# Patient Record
Sex: Male | Born: 1960 | Race: White | Hispanic: No | Marital: Married | Smoking: Former smoker
Health system: Southern US, Community
[De-identification: ages and names within clinical notes are randomized; demographics above are authoritative.]

## PROBLEM LIST (undated history)

## (undated) DIAGNOSIS — E785 Hyperlipidemia, unspecified: Secondary | ICD-10-CM

## (undated) DIAGNOSIS — F5104 Psychophysiologic insomnia: Secondary | ICD-10-CM

## (undated) HISTORY — PX: OTHER SURGICAL HISTORY: SHX169

## (undated) HISTORY — PX: COLONOSCOPY: SHX174

## (undated) HISTORY — PX: TONSILLECTOMY: SUR1361

## (undated) HISTORY — DX: Psychophysiologic insomnia: F51.04

## (undated) HISTORY — DX: Hyperlipidemia, unspecified: E78.5

---

## 2008-03-17 ENCOUNTER — Encounter: Payer: Self-pay | Admitting: Internal Medicine

## 2009-12-24 ENCOUNTER — Ambulatory Visit: Payer: Self-pay | Admitting: Internal Medicine

## 2009-12-24 DIAGNOSIS — G4700 Insomnia, unspecified: Secondary | ICD-10-CM

## 2009-12-30 DIAGNOSIS — E785 Hyperlipidemia, unspecified: Secondary | ICD-10-CM | POA: Insufficient documentation

## 2010-01-06 ENCOUNTER — Telehealth: Payer: Self-pay | Admitting: Internal Medicine

## 2010-01-11 ENCOUNTER — Encounter: Payer: Self-pay | Admitting: Internal Medicine

## 2010-02-04 ENCOUNTER — Ambulatory Visit: Payer: Self-pay | Admitting: Internal Medicine

## 2010-02-08 ENCOUNTER — Telehealth (INDEPENDENT_AMBULATORY_CARE_PROVIDER_SITE_OTHER): Payer: Self-pay | Admitting: *Deleted

## 2010-05-05 ENCOUNTER — Ambulatory Visit: Payer: Self-pay | Admitting: Internal Medicine

## 2010-09-14 ENCOUNTER — Telehealth: Payer: Self-pay | Admitting: Internal Medicine

## 2010-11-01 ENCOUNTER — Ambulatory Visit
Admission: RE | Admit: 2010-11-01 | Discharge: 2010-11-01 | Payer: Self-pay | Source: Home / Self Care | Attending: Internal Medicine | Admitting: Internal Medicine

## 2010-11-01 DIAGNOSIS — Z9189 Other specified personal risk factors, not elsewhere classified: Secondary | ICD-10-CM | POA: Insufficient documentation

## 2010-11-01 NOTE — Progress Notes (Signed)
Summary: prescripts  Phone Note From Other Clinic Call back at [phone]   [facility] Call For: young Summary of Call: have questions about flurazepam and lorazepam prescripts Initial call taken by: Rickard Patience,  Feb 08, 2010 2:07 PM  Follow-up for Phone Call        called and spoke with pharmacist.  pharmacist wanted to make sure CY knew pt was on both these meds.  informed pharmacist that per last OV note on 02-04-2010, pt was to try this new med to see if it helped his symptoms better vs the lorazapam.  pharmacist verbalized understanding.  Aundra Millet Reynolds LPN  Feb 08, 2010 2:26 PM   Problem # 1:  INSOMNIA, CHRONIC (ICD-307.42) He tosses and turns a lot in sleep. We will try a Berhe half-life sleep med.

## 2010-11-01 NOTE — Assessment & Plan Note (Signed)
Summary: 1 month/apc   Primary Provider/Referring Provider:  Royanne Foots  CC:  1 month followup.  Pt states that lunesta helps him to fall asleep- states that he wakes up about 2 hrs later and then has trouble falling back asleep.  Marland Kitchen  History of Present Illness:  History of Present Illness: December 24, 2009- 48 yoM self refferred for chronic insomnia with difficulty initiating sleep without medication. Onset around 1998. He remembers tendency to fall asleep in class around 1987-89.  [facility] [place] 2009- AHI=0/hr. Dx of insomnia was suggested. 2 other sleep studies in [facility]. Wife is separate room. She disturbs his sleep and she says he snores loudly.  In the evening he watches sports and gets excited til hard to wind down. Bedtime 11-1130PM, latency short with medicine "hours without". Up 7AM. Dozes off easily during day, especially havbing to fight sleep after lunch.  Has tried Restoril, Sonata, Temazepam, Lorazepam, Ambien, Xanax, wine, trazodone. Ambien worked til tolerance. Ambien CR caused depression.  Says at night he tends to lie with eyes shut and doze off and on. Every night takes some kind of med. No using Lorazepam 1-2 mg, with a fan for white noise. Caffeine only in AM  Feb 04, 2010- Insomnia Lunesta 3 mg would induce sleep, but for only 3 hours, then he would wake again, (taken with Lorazepam). Denies nightmares or physical discomfort. Just wakes up "racing mind'. He did look at CBT website but hasn't tried sustained practice with it. Recent TFT by his LMD reported normal. He has a brother who is also a chronic insomniac.    Current Medications (verified): 1)  Lorazepam 1 Mg Tabs (Lorazepam) .... Take 1 By Mouth Once Daily 2)  Lipitor 10 Mg Tabs (Atorvastatin Calcium) .... Take 1 By Mouth Once Daily 3)  Lunesta 3 Mg Tabs (Eszopiclone) .Marland Kitchen.. 1 At Bedtime As Needed  Allergies (verified): No Known Drug Allergies  Past History:  Past Medical History: Last updated:  12/24/2009 Chronic insomnia Hyperlipidemia  Past Surgical History: Last updated: 12/24/2009 Colonoscopy  Family History: Last updated: 12/24/2009 Emphysema-Father, aunt Cancer-Mother side  Social History: Last updated: 12/24/2009 Married with child Smoker for 13 years; quit in 1989. 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    Vital Signs:  Patient profile:   50 year old male Weight:      199 pounds O2 Sat:      100 % on Room air Pulse rate:   61 / minute BP sitting:   114 / 70  (left arm)  Vitals Entered By: Vernie Murders (Feb 04, 2010 2:32 PM)  O2 Flow:  Room air  Physical Exam  Additional Exam:  General: A/Ox3; pleasant and cooperative, NAD, trim, muscular man, mood/ affect is pleasant and appropriate. SKIN: no rash, lesions NODES: no lymphadenopathy HEENT: [place]/AT, EOM- WNL, Conjuctivae- clear, PERRLA, TM-WNL, Nose- clear, Throat- clear and wnl, Mallampati  III NECK: Supple w/ fair ROM, JVD- none, normal carotid impulses w/o bruits Thyroid- normal to palpation CHEST: Clear to P&A HEART: RRR, no m/g/r heard ABDOMEN: Soft and nl;  KGU:RKYH, nl pulses, no edema  NEURO: Grossly intact to observation      Impression & Recommendations:  Problem # 1:  INSOMNIA, CHRONIC (ICD-307.42)  He relaxes and can drift  off at home, hearing snoring as he drifts off. His wife tells him he snores loudly. The noise wakes him. He tosses and turns a lot in sleep. We will try a [place] half-life sleep med.  We may need to consider an apnea-link, then consider if he needs formal retest, since previous sleep study didn't show significant apnea.  Medications Added to Medication List This Visit: 1)  Flurazepam Hcl 30 Mg  Caps (Flurazepam hcl) .Marland Kitchen.. 1 for sleep as needed  Other Orders: Est. Patient Level III (16109)  Patient Instructions: 1)  Please schedule a follow-up appointment in 3 months. 2)  it may help you to keep working with the CBT web site. 3)  Try flurazepam for sleep. Script written. Prescriptions: FLURAZEPAM HCL 30 MG CAPS (FLURAZEPAM HCL) 1 for sleep as needed  #30 x 0   Entered and Authorized by:   Waymon Budge MD   Signed by:   Waymon Budge MD on 02/04/2010   Method used:   Print then Give to Patient   RxID:   [id number]

## 2010-11-01 NOTE — Progress Notes (Signed)
Summary: authorization  Phone Note Call from Patient   Caller: Patient Call For: young Summary of Call: need refill for lunesta [facility] [place] Initial call taken by: Rickard Patience,  January 06, 2010 4:50 PM  Follow-up for Phone Call        Called [facility] to initiate PA for lunesta.  Case ID number is [id number] ATC pt and inform this is being handled.  Had to [facility].  Form was recieved and placed in Dr Roxy Cedar lookat along with this msg to be addressed Vernie Murders  January 06, 2010 5:20 PM  Follow-up by: Vernie Murders,  January 06, 2010 4:59 PM  Additional Follow-up for Phone Call Additional follow up Details #1::        Form has been completed,signed, and faxed.Placed in awaiting answer pile in triage.Reynaldo Minium CMA  January 07, 2010 11:27 AM   Claris Che states pt called back very upset because he had not heard anything about PA. SH eadvised him it was in process. he is out of [place] and does not want to go the weekend without it. Pelase advise if we can give him samples to last a few days until we receive a response from ins comp, which should be in about 2 business days per insurance. Carron Curie CMA  January 07, 2010 4:05 PM     Additional Follow-up for Phone Call Additional follow up Details #2::    Please give pt enough samples to last until we can get an answer back from insurance.Reynaldo Minium CMA  January 07, 2010 4:29 PM   Dr Maple Hudson, please advise how many samples of lunesta 3 mg you want me to give this pt, thanks Vernie Murders  January 07, 2010 4:54 PM   Additional Follow-up for Phone Call Additional follow up Details #3:: Details for Additional Follow-up Action Taken: called and spoke with pt about the process of the prior auth--he didnt understand what that meant---so he is aware that we have samples up here for him to pick up---pt stated that he will just come on monday to pick these up as he is in [place].  i offered to leave them downstairs for him to pick up  tomorrow at the clinic but he stated that he did have some to get through the weekend with.  explained to pt that we will call him once the prior auth has been recieved back and let him know the results. Additional Follow-up by: Randell Loop CMA,  January 07, 2010 5:06 PM   Appended Document: authorization pt and pharmacy, [facility], notified of approval.

## 2010-11-01 NOTE — Assessment & Plan Note (Signed)
Summary: INSOMNIA- SELF REFERRAL ///KP   Primary Provider/Referring Provider:  Royanne Foots  CC:  Sleep-new pt..  History of Present Illness: December 24, 2009- 50 yoM self refferred for chronic insomnia with difficulty initiating sleep without medication. Onset around 1998. He remembers tendency to fall asleep in class around 1987-89.  [facility] [place] 2009- AHI=0/hr. Dx of insomnia was suggested. 2 other sleep studies in [facility]. Wife is separate room. She disturbs his sleep and she says he snores loudly.  In the evening he watches sports and gets excited til hard to wind down. Bedtime 11-1130PM, latency short with medicine "hours without". Up 7AM. Dozes off easily during day, especially havbing to fight sleep after lunch.  Has tried Restoril, Sonata, Temazepam, Lorazepam, Ambien, Xanax, wine, trazodone. Ambien worked til tolerance. Ambien CR caused depression.  Says at night he tends to lie with eyes shut and doze off and on. Every night takes some kind of med. No using Lorazepam 1-2 mg, with a fan for white noise. Caffeine only in AM  Current Medications (verified): 1)  Lorazepam 1 Mg Tabs (Lorazepam) .... Take 1 By Mouth Once Daily 2)  Lipitor 10 Mg Tabs (Atorvastatin Calcium) .... Take 1 By Mouth Once Daily  Allergies (verified): No Known Drug Allergies  Past History:  Family History: Last updated: 12/24/2009 Emphysema-Father, aunt Cancer-Mother side  Social History: Last updated: 12/24/2009 Married with child Smoker for 13 years; quit in 1989. 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  Vital Signs:  Patient profile:   50 year old male Height:      73 inches Weight:      205.13 pounds BMI:     27.16 O2 Sat:       95 % on Room air Pulse rate:   69 / minute BP sitting:   112 / 68  (left arm) Cuff size:   regular  Vitals Entered By: Reynaldo Minium CMA (December 24, 2009 3:09 PM)  O2 Flow:  Room air  Physical Exam  Additional Exam:  General: A/Ox3; pleasant and cooperative, NAD, trim, muscular man SKIN: no rash, lesions NODES: no lymphadenopathy HEENT: [place]/AT, EOM- WNL, Conjuctivae- clear, PERRLA, TM-WNL, Nose- clear, Throat- clear and wnl, Mallampati  III NECK: Supple w/ fair ROM, JVD- none, normal carotid impulses w/o bruits Thyroid- normal to palpation CHEST: Clear to P&A HEART: RRR, no m/g/r heard ABDOMEN: Soft and nl; nml bowel sounds; no organomegaly or masses noted ZOX:WRUE, nl pulses, no edema  NEURO: Grossly intact to observation      Impression & Recommendations:  Problem # 1:  INSOMNIA, CHRONIC (ICD-307.42) There is no hx of thyroid problem. We addressed sleep hygiene. He needs  some quit time before bed at night. He is told he snores, but he has had 3 sleep studies without diagnosing significant apnea. He seems healthy and is probably a natural light sleeper. We discussed cognitive behavioral therapy as a nonpharmaceutical approach. We will begin a trial of this with Lunesta and see how he does.  Medications Added to Medication List This Visit: 1)  Lorazepam 1 Mg Tabs (Lorazepam) .... Take 1 by mouth once daily 2)  Lipitor 10 Mg Tabs (Atorvastatin calcium) .... Take 1 by mouth once daily 3)  Lunesta 3 Mg Tabs (Eszopiclone) .Marland Kitchen.. 1 at bedtime as needed  Other  Orders: Consultation Level IV (19147)  Patient Instructions: 1)  Please schedule a follow-up appointment in 1 month. 2)  Consider the teaching website for cognitive behavioral therapy: 3)    4)                            [email or web address] 5)  sample/ script Lunesta 3 mg: 1 as needed at bedtime for sleep Prescriptions: LUNESTA 3 MG TABS (ESZOPICLONE) 1 at bedtime as needed  #30 x 5   Entered and Authorized by:    Waymon Budge MD   Signed by:   Waymon Budge MD on 12/24/2009   Method used:   Print then Give to Patient   RxID:   [id number]

## 2010-11-01 NOTE — Medication Information (Signed)
Summary: Tax adviser   Imported By: Lehman Prom 01/11/2010 16:26:34  _____________________________________________________________________  External Attachment:    Type:   Image     Comment:   External Document

## 2010-11-01 NOTE — Assessment & Plan Note (Signed)
Summary: rov 3 months///kp   Primary Provider/Referring Provider:  Royanne Foots  CC:  3 month follow up visit-insomnia.  History of Present Illness:  History of Present Illness: December 24, 2009- 48 yoM self refferred for chronic insomnia with difficulty initiating sleep without medication. Onset around 1998. He remembers tendency to fall asleep in class around 1987-89.  [facility] [place] 2009- AHI=0/hr. Dx of insomnia was suggested. 2 other sleep studies in [place]. Wife is separate room. She disturbs his sleep and she says he snores loudly.  In the evening he watches sports and gets excited til hard to wind down. Bedtime 11-1130PM, latency short with medicine "hours without". Up 7AM. Dozes off easily during day, especially havbing to fight sleep after lunch.  Has tried Restoril, Sonata, Temazepam, Lorazepam, Ambien, Xanax, wine, trazodone. Ambien worked til tolerance. Ambien CR caused depression.  Says at night he tends to lie with eyes shut and doze off and on. Every night takes some kind of med. No using Lorazepam 1-2 mg, with a fan for white noise. Caffeine only in AM  Feb 04, 2010- Insomnia Lunesta 3 mg would induce sleep, but for only 3 hours, then he would wake again, (taken with Lorazepam). Denies nightmares or physical discomfort. Just wakes up "racing mind'. He did look at CBT website but hasn't tried sustained practice with it. Recent TFT by his LMD reported normal. He has a brother who is also a chronic insomniac.  May 05, 2010- Chronic insomnia He is very pleased with flurazepam, which is helping him sleep comfortably through the night. He has been taking it around 11PM and admits that might be a little too late. He is not taking Lorazepam now at all. He feels able to drop the sleep issue as a factor for daily concern.    Preventive Screening-Counseling & Management  Alcohol-Tobacco     Smoking Status: quit     Year Started: 1976     Year Quit: 1988  Current  Medications (verified): 1)  Lipitor 10 Mg Tabs (Atorvastatin Calcium) .... Take 1 By Mouth Once Daily 2)  Flurazepam Hcl 30 Mg Caps (Flurazepam Hcl) .Marland Kitchen.. 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    Vital Signs:  Patient profile:   50 year old male Height:      73 inches Weight:      206.13 pounds BMI:     27.29 O2 Sat:      94 % on Room air Pulse rate:   71 / minute BP sitting:   110 / 60  (right arm) Cuff size:   regular  Vitals Entered By: Reynaldo Minium CMA (May 05, 2010 4:05 PM)  O2 Flow:  Room air CC: 3 month follow up visit-insomnia   Physical Exam  Additional Exam:  General: A/Ox3; pleasant and cooperative, NAD, trim, muscular man, mood/  affect is pleasant and appropriate. He seems calm, relaxed and appropriate. SKIN: no rash, lesions NODES: no lymphadenopathy HEENT: [place]/AT, EOM- WNL, Conjuctivae- clear, PERRLA, TM-WNL, Nose- clear, Throat- clear and wnl, Mallampati  III NECK: Supple w/ fair ROM, JVD- none, normal carotid impulses w/o bruits Thyroid- normal to palpation CHEST: Clear to P&A HEART: RRR, no m/g/r heard ABDOMEN: Soft and nl;  IRJ:JOAC, nl pulses, no edema  NEURO: Grossly intact to  observation      Impression & Recommendations:  Problem # 1:  INSOMNIA, CHRONIC (ICD-307.42)  He has been doing very well with flurazepam. We discussed sleep hygiene, tolerance, emotional and physical dependence. We will let him use this as needed.  Other Orders: Est. Patient Level III (16606)  Patient Instructions: 1)  Please schedule a follow-up appointment in 6 months. 2)  Script to refill flurazepam - try to skip it occasionally if you can, to help delay getting tolerant to where it won't work as well. Prescriptions: FLURAZEPAM HCL 30 MG CAPS (FLURAZEPAM HCL) 1 for sleep as needed  #30 x 5   Entered and Authorized by:   Waymon Budge MD   Signed by:   Waymon Budge MD on 05/05/2010   Method used:   Print then Give to Patient   RxID:   [id number]

## 2010-11-03 NOTE — Progress Notes (Signed)
Summary: refill status  Phone Note Call from Patient Call back at [phone]   Caller: Patient Call For: young Summary of Call: Checking on refill status for flurazopam. Initial call taken by: Darletta Moll,  September 14, 2010 11:02 AM  Follow-up for Phone Call        pt requesting refill on flurazepam 30mg  caps. Last filled on 05-05-10 x 5 fills. Please advise if ok to refill.Carron Curie CMA  September 14, 2010 12:00 PM   Additional Follow-up for Phone Call Additional follow up Details #1::        OK to refill x 5,  thanks. Additional Follow-up by: Waymon Budge MD,  September 14, 2010 1:53 PM    Additional Follow-up for Phone Call Additional follow up Details #2::    rx sent. pt aware.Carron Curie CMA  September 14, 2010 2:16 PM   Prescriptions: FLURAZEPAM HCL 30 MG CAPS (FLURAZEPAM HCL) 1 for sleep as needed  #30 x 5   Entered by:   Carron Curie CMA   Authorized by:   Waymon Budge MD   Signed by:   Carron Curie CMA on 09/14/2010   Method used:   Telephoned to ...       [facility] [address] (retail)       [address]       [place], [place]  [address]       Ph: [phone]       Fax: [phone]   RxID:   [id number]

## 2010-11-09 NOTE — Assessment & Plan Note (Signed)
Summary: f/u 6 months///kp   Primary Provider/Referring Provider:  Royanne Foots  CC:  6 month follow up visit-insomnia; napping around 4pm at times..  History of Present Illness: Feb 04, 2010- Insomnia Lunesta 3 mg would induce sleep, but for only 3 hours, then he would wake again, (taken with Lorazepam). Denies nightmares or physical discomfort. Just wakes up "racing mind'. He did look at CBT website but hasn't tried sustained practice with it. Recent TFT by his LMD reported normal. He has a brother who is also a chronic insomniac.  May 05, 2010- Chronic insomnia He is very pleased with flurazepam, which is helping him sleep comfortably through the night. He has been taking it around 11PM and admits that might be a little too late. He is not taking Lorazepam now at all. He feels able to drop the sleep issue as a factor for daily concern.  November 01, 2010- Chronic insomnia, snoring Nurse-CC: 6 month follow up visit-insomnia; napping around 4pm at times. He admits a nap when he gets quiet in afternoon since he isn't outside a lot. He went on trip and his family commented on his loud snoring. Brother said he was gagging. A sleep study in 2009 showed AHI 0 with moderate snoring. He has had 3 sleep studies without signficant apnea.  Hx T&A out, nasal fx fixed.       Preventive Screening-Counseling & Management  Alcohol-Tobacco     Smoking Status: quit     Year Started: 1976     Year Quit: 1988  Current Medications (verified): 1)  Lipitor 10 Mg Tabs (Atorvastatin Calcium) .... Take 1 By Mouth Once Daily 2)  Flurazepam Hcl 30 Mg Caps (Flurazepam Hcl) .Marland Kitchen.. 1 For Sleep As Needed  Allergies (verified): No Known Drug Allergies  Past History:  Past Surgical History: Colonoscopy Tonsillectomy Nasal fracture reset  Review of Systems      See HPI  The patient denies anorexia, fever, weight loss, weight gain, vision loss, decreased hearing, hoarseness, chest pain, syncope, dyspnea  on exertion, peripheral edema, prolonged cough, headaches, hemoptysis, abdominal pain, melena, hematochezia, severe indigestion/heartburn, muscle weakness, depression, abnormal bleeding, enlarged lymph nodes, and angioedema.    Vital Signs:  Patient profile:   50 year old male Height:      73 inches Weight:      207 pounds BMI:     27.41 O2 Sat:      95 % on Room air Pulse rate:   60 / minute BP sitting:   114 / 68  (left arm) Cuff size:   regular  Vitals Entered By: Reynaldo Minium CMA (November 01, 2010 4:35 PM)  O2 Flow:  Room air CC: 6 month follow up visit-insomnia; napping around 4pm at times.   Physical Exam  Additional Exam:  General: A/Ox3; pleasant and cooperative, NAD, trim, muscular man, mood/ affect is pleasant and appropriate. He seems calm, relaxed and appropriate. SKIN: no rash, lesions NODES: no lymphadenopathy HEENT: [place]/AT, EOM- WNL, Conjuctivae- clear, PERRLA, TM-WNL, Nose- clear, Throat- clear and wnl, Mallampati  III, mandible a little short.  NECK: Supple w/ fair ROM, JVD- none, normal carotid impulses w/o bruits Thyroid- normal to palpation CHEST: Clear to P&A HEART: RRR, no m/g/r heard ABDOMEN: Soft and nl;  JXB:JYNW, nl pulses, no edema  NEURO: Grossly intact to observation      Impression & Recommendations:  Problem # 1:  INSOMNIA, CHRONIC (ICD-307.42)  He likes flurazepam best of any med tried. Cautioned against overnapping with  emphasis on sleep hygiene. He has had 3 prior sleep studies without documentation of OSA, despite family reports of impressive snoring. We are going to have him try devices for snoring, including chin strap, tennis ball, boil and bite device, nasal strips.   Other Orders: Est. Patient Level III (16109)  Patient Instructions: 1)  Please schedule a follow-up appointment in 1 year. 2)  Ok to continue flurazepam as needed 3)  Be careful to keep good sleep habits 4)  For snoring: 5)  sleep on sides- it can help to sew a  tennis ball into the back of your nght shirt. 6)  Nasal strips 7)  Chin strap 8)  Consider a boil and bite type mouth piece from drug store to hold your jaw in place at night.

## 2011-03-15 ENCOUNTER — Other Ambulatory Visit: Payer: Self-pay | Admitting: Internal Medicine

## 2011-03-15 ENCOUNTER — Telehealth: Payer: Self-pay | Admitting: Internal Medicine

## 2011-03-15 MED ORDER — FLURAZEPAM HCL 30 MG PO CAPS: ORAL_CAPSULE | ORAL | Status: DC

## 2011-03-15 NOTE — Telephone Encounter (Signed)
Pt last saw CY on 11/01/2010 and was told to f/u in 1 year. No pending appts.  We last gave him rx on 09/14/10 for # 30 x 5 refills.  Please advise if ok to send refills or not.  Thanks.   NKDA

## 2011-03-15 NOTE — Telephone Encounter (Signed)
Ok to refill flurazepam total of 6 months. Please remind he will need OV in December.     Thank you

## 2011-03-15 NOTE — Telephone Encounter (Signed)
rx sent to pharmacy

## 2011-06-15 ENCOUNTER — Telehealth: Payer: Self-pay | Admitting: Internal Medicine

## 2011-06-15 NOTE — Telephone Encounter (Signed)
ATC pt, NA and no option given to leave a VM. WCB.

## 2011-06-16 MED ORDER — FLURAZEPAM HCL 30 MG PO CAPS: ORAL_CAPSULE | ORAL | Status: DC

## 2011-06-16 NOTE — Telephone Encounter (Signed)
Spoke with the pt and he requests that from now on a 90 day supply of his medication be sent into [facility] instead of [facility]. I have faxed rx to [facility] to place on hold until pt calls for refill. Pt aware.Carron Curie, CMA

## 2011-06-16 NOTE — Telephone Encounter (Signed)
Rx signed by CDY and faxed to [facility].

## 2012-01-10 ENCOUNTER — Other Ambulatory Visit: Payer: Self-pay | Admitting: *Deleted

## 2012-01-10 NOTE — Telephone Encounter (Signed)
[facility] Flurazepam 30mg  tablets 1 tab as needed for sleep. Pt last OV on 10-2010 with no pending appts. Please advise if ok to refill. Carron Curie, CMA

## 2012-01-10 NOTE — Telephone Encounter (Signed)
We haven't seen him in over a year. His primary physician can refill this for him.  Or- His choice- we can refill # 30, with no refill, to last until he keeps an appointment to f/u here with me.

## 2012-01-10 NOTE — Telephone Encounter (Signed)
LMOMTCB x 1 

## 2012-01-11 ENCOUNTER — Telehealth: Payer: Self-pay | Admitting: Internal Medicine

## 2012-01-11 MED ORDER — FLURAZEPAM HCL 30 MG PO CAPS: ORAL_CAPSULE | ORAL | Status: DC

## 2012-01-11 NOTE — Telephone Encounter (Signed)
Rx printed and given to CDY to sign to that we can fax. Pt aware.

## 2012-01-11 NOTE — Telephone Encounter (Signed)
Spoke with pt and he is requesting refill for flurazepam for 90 day supply, wants this sent to [facility]. Pt last seen Jan 2012. I have scheduled him rov for 02/23/12. Please advise if okay to send refill, thanks!

## 2012-01-11 NOTE — Telephone Encounter (Signed)
Ok to refill 

## 2012-01-22 NOTE — Telephone Encounter (Signed)
[email or web address]. Izaac Reisig, CMA  

## 2012-01-23 ENCOUNTER — Encounter: Payer: Self-pay | Admitting: *Deleted

## 2012-01-23 ENCOUNTER — Ambulatory Visit (INDEPENDENT_AMBULATORY_CARE_PROVIDER_SITE_OTHER): Payer: 59 | Admitting: Internal Medicine

## 2012-01-23 VITALS — BP 120/74 | HR 73 | Ht 73.0 in | Wt 215.8 lb

## 2012-01-23 DIAGNOSIS — G4700 Insomnia, unspecified: Secondary | ICD-10-CM

## 2012-01-23 NOTE — Patient Instructions (Signed)
Please call as needed 

## 2012-01-23 NOTE — Progress Notes (Signed)
01/23/12- 50 yoM former smoker followed for chronic insomnia with snoring LOV- 11/01/10 Dalmane is working well for sustain sleep. He still feels sleepy after lunch and he will nap for 2 hours on couch when he returns home after work. Denies problem with driving.   ROS-See HPI Constitutional:   No-   weight loss, night sweats, fevers, chills, fatigue, lassitude. HEENT:   No-  headaches, difficulty swallowing, tooth/dental problems, sore throat,       No-  sneezing, itching, ear ache, nasal congestion, post nasal drip,  CV:  No-   chest pain, orthopnea, PND, swelling in lower extremities, anasarca,  dizziness, palpitations Resp: No-   shortness of breath with exertion or at rest.              No-   productive cough,  No non-productive cough,  No- coughing up of blood.              No-   change in color of mucus.  No- wheezing.   Skin: No-   rash or lesions. GI:  No-   heartburn, indigestion, abdominal pain, nausea, vomiting,  GU: No-   dysuria,  MS:  No-   joint pain or swelling.  . Neuro-     nothing unusual Psych:  No- change in mood or affect. No depression or anxiety.  No memory loss.   OBJ- Physical Exam General- Alert, Oriented, Affect-appropriate, Distress- none acute, trim, alert, talkative Skin- rash-none, lesions- none, excoriation- none Lymphadenopathy- none Head- atraumatic            Eyes- Gross vision intact, PERRLA, conjunctivae and secretions clear            Ears- Hearing, canals-normal            Nose- Clear, no-Septal dev, mucus, polyps, erosion, perforation             Throat- Mallampati II , mucosa clear , drainage- none, tonsils- atrophic Neck- flexible , trachea midline, no stridor , thyroid nl, carotid no bruit Chest - symmetrical excursion , unlabored           Heart/CV- RRR , no murmur , no gallop  , no rub, nl s1 s2                           - JVD- none , edema- none, stasis changes- none, varices- none           Lung- clear to P&A, wheeze- none, cough- none ,  dullness-none, rub- none           Chest wall-  Abd- Br/ Gen/ Rectal- Not done, not indicated Extrem- cyanosis- none, clubbing, none, atrophy- none, strength- nl Neuro- grossly intact to observation

## 2012-01-26 NOTE — Assessment & Plan Note (Signed)
We spent most of the session discussing sleep hygiene. Dalmane is working well for now and we can continue that. Discussed the Poudrier half-life.

## 2012-02-23 ENCOUNTER — Ambulatory Visit: Payer: Self-pay | Admitting: Internal Medicine

## 2012-07-02 ENCOUNTER — Telehealth: Payer: Self-pay | Admitting: Internal Medicine

## 2012-07-02 MED ORDER — FLURAZEPAM HCL 30 MG PO CAPS: ORAL_CAPSULE | ORAL | Status: DC

## 2012-07-02 NOTE — Telephone Encounter (Signed)
Per CY-okay to refill. I have printed RX and faxed to [facility]. Left message for patient that this has been taken care of and if any questions or concerns to please call our office.

## 2012-07-02 NOTE — Telephone Encounter (Signed)
CY-please advise if okay to refill; pt last seen 01-23-12 and told to F/U 1 year; pt would like 90 day supply with 1 refill. Thanks.

## 2012-07-16 ENCOUNTER — Telehealth: Payer: Self-pay | Admitting: Internal Medicine

## 2012-07-16 MED ORDER — FLURAZEPAM HCL 30 MG PO CAPS: ORAL_CAPSULE | ORAL | Status: DC

## 2012-07-16 NOTE — Telephone Encounter (Signed)
Flurazepam 30mg  qhs prn refilled on 10.1.13 via telephone note for #90 with 1 refill.  Pt last seen 4.23.13.  Called [facility], spoke with pharmacist Stormy (verified name) who verified that the 10.1.13 rx was not received.  However, Stormy stated that this can be received verbally.  Verbal order for flurazepam 30mg  given as documented above.  Called spoke with patient who apologized for providing the incorrect fax to [facility] last conversation.  He stated that he has 2 capsules left and is requesting a short-term rx for this to his local [facility] in [place].  Dr Maple Hudson please advise if okay to send and quantity.  Thanks!

## 2012-07-16 NOTE — Telephone Encounter (Signed)
Rx for 1 month supply was sent to pharm. Spoke with the pt and notified that this was done.

## 2012-07-16 NOTE — Telephone Encounter (Signed)
Per CY-okay to send 1 month Rx Flurazepam 30 mg #30 1 qhs prn sleep no refills.

## 2013-01-14 ENCOUNTER — Telehealth: Payer: Self-pay | Admitting: Internal Medicine

## 2013-01-14 NOTE — Telephone Encounter (Signed)
Spoke with patientt, Aware Rx has been sent to verified pharmacy Nothing further needed at this time

## 2013-01-14 NOTE — Telephone Encounter (Signed)
ATC patient, no answer LMOMTCB 

## 2013-01-14 NOTE — Telephone Encounter (Signed)
Patient requesting Rx for Flurazepam 30mg -- 1 cap for sleep as needed-- to last him until his appt on Jun 3 ,2014  Medication last filled 07/16/12 #30 0 refills  Last Appt: 01/23/12  Pharmacy: [facility]

## 2013-01-14 NOTE — Telephone Encounter (Signed)
Pt returned triage's call.  Holly D Pryor ° °

## 2013-03-04 ENCOUNTER — Encounter: Payer: Self-pay | Admitting: Internal Medicine

## 2013-03-04 ENCOUNTER — Ambulatory Visit (INDEPENDENT_AMBULATORY_CARE_PROVIDER_SITE_OTHER): Payer: 59 | Admitting: Internal Medicine

## 2013-03-04 VITALS — BP 114/70 | HR 68 | Ht 73.0 in | Wt 208.4 lb

## 2013-03-04 DIAGNOSIS — G4700 Insomnia, unspecified: Secondary | ICD-10-CM

## 2013-03-04 DIAGNOSIS — Z9189 Other specified personal risk factors, not elsewhere classified: Secondary | ICD-10-CM

## 2013-03-04 MED ORDER — FLURAZEPAM HCL 30 MG PO CAPS: ORAL_CAPSULE | ORAL | Status: DC

## 2013-03-04 MED ORDER — AZELASTINE-FLUTICASONE 137-50 MCG/ACT NA SUSP: 2.0000 | Freq: Every day | NASAL | Status: DC

## 2013-03-04 NOTE — Progress Notes (Signed)
01/23/12- 50 yoM former smoker followed for chronic insomnia with snoring LOV- 11/01/10 Dalmane is working well for sustain sleep. He still feels sleepy after lunch and he will nap for 2 hours on couch when he returns home after work. Denies problem with driving.    03/04/13- 50 yoM former smoker followed for chronic insomnia with snoring FOLLOWS ZOX:WRUEA refill for flurazepam Reports loud snoring- Wife complains- and perennial nasal stuffiness daily on the right side. History of septoplasty. NPSG 03/17/2008 at [facility]- AHI 0/hr, negative for sleep apnea , mild to moderate snoring. Body weight was 190 pounds He asks refill of lorazepam to help consolidate sleep.  ROS-See HPI Constitutional:   No-   weight loss, night sweats, fevers, chills, fatigue, lassitude. HEENT:   No-  headaches, difficulty swallowing, tooth/dental problems, sore throat,       No-  sneezing, itching, ear ache, +nasal congestion, post nasal drip,  CV:  No-   chest pain, orthopnea, PND, swelling in lower extremities, anasarca,  dizziness, palpitations Resp: No-   shortness of breath with exertion or at rest.              No-   productive cough,  No non-productive cough,  No- coughing up of blood.              No-   change in color of mucus.  No- wheezing.   Skin: No-   rash or lesions. GI:  No-   heartburn, indigestion, abdominal pain, nausea, vomiting,  GU:  MS:  No-   joint pain or swelling.  . Neuro-     nothing unusual Psych:  No- change in mood or affect. No depression or anxiety.  No memory loss.   OBJ- Physical Exam General- Alert, Oriented, Affect-appropriate, Distress- none acute, trim, alert, talkative Skin- rash-none, lesions- none, excoriation- none Lymphadenopathy- none Head- atraumatic            Eyes- Gross vision intact, PERRLA, conjunctivae and secretions clear            Ears- Hearing, canals-normal            Nose- Clear, no-Septal dev, mucus, polyps, erosion, perforation         Throat- Mallampati II-III , mucosa clear , drainage- none, tonsils- atrophic Neck- flexible , trachea midline, no stridor , thyroid nl, carotid no bruit Chest - symmetrical excursion , unlabored           Heart/CV- RRR , no murmur , no gallop  , no rub, nl s1 s2                           - JVD- none , edema- none, stasis changes- none, varices- none           Lung- clear to P&A, wheeze- none, cough- none , dullness-none, rub- none           Chest wall-  Abd- Br/ Gen/ Rectal- Not done, not indicated Extrem- cyanosis- none, clubbing, none, atrophy- none, strength- nl Neuro- grossly intact to observation

## 2013-03-04 NOTE — Patient Instructions (Addendum)
Script refilling Dalmane/ flurazepam  Sample Dymista nasal spray      Try 1-2 puffs each nostril once daily at bedtime   See if this helps the snoring  You might try one of the otc "boil and bite" type mouthpiece tooth guards. Set it with your lower jaw just a little forward of its normal position. See if it holds your throat open better while you sleep, to reduce snoring.  Please call as needed

## 2013-03-06 ENCOUNTER — Telehealth: Payer: Self-pay | Admitting: Internal Medicine

## 2013-03-06 MED ORDER — FLURAZEPAM HCL 30 MG PO CAPS: ORAL_CAPSULE | ORAL | Status: DC

## 2013-03-06 NOTE — Telephone Encounter (Signed)
Ask him to tear up his current script "so we don't get report that he is getting duplicate prescriptions". Ok to change it to 90 day script, refill x 3

## 2013-03-06 NOTE — Telephone Encounter (Signed)
Florazepam rx was printed and placed on CDY's cart for signature so we can fax it to [facility]. lmomtcb for pt to inform him of below per CDY.

## 2013-03-06 NOTE — Telephone Encounter (Signed)
According to EPIC this was printed out at last ov 03/04/13.  Pt states he DOES have RX for the flurazepam but he would like to get this through [facility]. He wants [place] to call this in or fax an rx in for him. Dr. Maple Hudson since pt still has his RX is this okay to do so? Please advise thanks  Last OV 03/04/13 No pending appt

## 2013-03-06 NOTE — Telephone Encounter (Signed)
Pt returned triage's call.  Advised pt that Rx for florazepam was printed & placed on CDY's cart for signature so we can fax it to [facility].  Advised pt to tear up his current script "so we do get report that he is getting duplicate prescriptions.  Pt confirmed this stating he will shred it.  Pt aware Rx faxed to [facility] per Katie.  Pt verbalized understanding & states nothing further needed at this time.  Connor Guerrero

## 2013-03-15 NOTE — Assessment & Plan Note (Signed)
Reviewed sleep hygiene. Plan-refill of lfluorazepam

## 2013-03-15 NOTE — Assessment & Plan Note (Addendum)
He has not demonstrated sleep apnea. His snoring is related to the posterior nasal obstruction (probably deviated septum) and moderate palate length we discussed available measures reducing snoring including side sleeping, treatment and rhinitis and use of an OTC boil and bite type mouthpiece Plan-try sample Dymista,

## 2013-06-18 ENCOUNTER — Telehealth: Payer: Self-pay | Admitting: Internal Medicine

## 2013-06-18 MED ORDER — FLURAZEPAM HCL 30 MG PO CAPS: ORAL_CAPSULE | ORAL | Status: DC

## 2013-06-18 NOTE — Telephone Encounter (Signed)
I spoke with pt and he stated the [facility] is currently out of flurazepam until November. I advised [facility] I have done so and nothing further needed.

## 2013-06-18 NOTE — Telephone Encounter (Signed)
LMOMTCB x1 for pt. Does he want 30 or 90 days?

## 2014-02-10 ENCOUNTER — Telehealth: Payer: Self-pay | Admitting: Internal Medicine

## 2014-02-10 MED ORDER — FLURAZEPAM HCL 30 MG PO CAPS: ORAL_CAPSULE | ORAL | Status: DC

## 2014-02-10 NOTE — Telephone Encounter (Signed)
Ok to refill for 90 days Needs appointment for ROV before that runs out.,

## 2014-02-10 NOTE — Telephone Encounter (Signed)
Last OV 03/04/13. Last refill 06/18/13 #30 x 1 refill No pending appt Pt requesting 90 day refill.  Please advise CDY thanks

## 2014-02-10 NOTE — Telephone Encounter (Signed)
Spoke with pt. appt scheduled. RX printed for CDY to sign and fax. Nothing furtherneeded

## 2014-03-17 ENCOUNTER — Ambulatory Visit (INDEPENDENT_AMBULATORY_CARE_PROVIDER_SITE_OTHER): Payer: 59 | Admitting: Internal Medicine

## 2014-03-17 ENCOUNTER — Encounter: Payer: Self-pay | Admitting: Internal Medicine

## 2014-03-17 ENCOUNTER — Ambulatory Visit (INDEPENDENT_AMBULATORY_CARE_PROVIDER_SITE_OTHER)
Admission: RE | Admit: 2014-03-17 | Discharge: 2014-03-17 | Disposition: A | Discharge status: Home / Self Care | Payer: 59 | Source: Ambulatory Visit | Attending: Internal Medicine | Admitting: Internal Medicine

## 2014-03-17 VITALS — BP 122/60 | HR 68 | Ht 73.5 in | Wt 208.8 lb

## 2014-03-17 DIAGNOSIS — R0600 Dyspnea, unspecified: Secondary | ICD-10-CM

## 2014-03-17 DIAGNOSIS — R0609 Other forms of dyspnea: Secondary | ICD-10-CM | POA: Insufficient documentation

## 2014-03-17 DIAGNOSIS — R0989 Other specified symptoms and signs involving the circulatory and respiratory systems: Secondary | ICD-10-CM

## 2014-03-17 DIAGNOSIS — G4700 Insomnia, unspecified: Secondary | ICD-10-CM

## 2014-03-17 MED ORDER — FLURAZEPAM HCL 30 MG PO CAPS: ORAL_CAPSULE | ORAL | Status: DC

## 2014-03-17 NOTE — Progress Notes (Signed)
01/23/12- 50 yoM former smoker followed for chronic insomnia with snoring LOV- 11/01/10 Dalmane is working well for sustain sleep. He still feels sleepy after lunch and he will nap for 2 hours on couch when he returns home after work. Denies problem with driving.    03/04/13- 50 yoM former smoker followed for chronic insomnia with snoring FOLLOWS VOZ:DGUYQFOR:needs refill for flurazepam Reports loud snoring- Wife complains- and perennial nasal stuffiness daily on the right side. History of septoplasty. NPSG 03/17/2008 at [facility]- AHI 0/hr, negative for sleep apnea , mild to moderate snoring. Body weight was 190 pounds He asks refill of lorazepam to help consolidate sleep.  03/17/14- 53 yo m former smoker followed for chronic insomnia with snoring Follows For: Sleeping averaging 6 hrs/night - Doing well with Flurazepam. Sleeps great and says it is no longer an anxious issue for him. Discussed flurazepam. New concern: He asks about declining O2 sat 97% gradually to 93% over las 2 years. Some DOE if vigorous. No anemia, cough, wheeze, chest pain. Office Spirometry 03/17/14- WNL. FVC 5.46/ 101%, FEV1 4.50/ 107%, FEV1/FVC 0.82, FEF25-75% 5.07/130%  ROS-See HPI Constitutional:   No-   weight loss, night sweats, fevers, chills, fatigue, lassitude. HEENT:   No-  headaches, difficulty swallowing, tooth/dental problems, sore throat,       No-  sneezing, itching, ear ache, +nasal congestion, post nasal drip,  CV:  No-   chest pain, orthopnea, PND, swelling in lower extremities, anasarca,  dizziness, palpitations Resp: + shortness of breath with exertion or at rest.              No-   productive cough,  No non-productive cough,  No- coughing up of blood.              No-   change in color of mucus.  No- wheezing.   Skin: No-   rash or lesions. GI:  No-   heartburn, indigestion, abdominal pain, nausea, vomiting,  GU:  MS:  No-   joint pain or swelling.  . Neuro-     nothing unusual Psych:  No-  change in mood or affect. No depression or anxiety.  No memory loss.   OBJ- Physical Exam General- Alert, Oriented, Affect-appropriate, Distress- none acute, trim, alert, talkative Skin- rash-none, lesions- none, excoriation- none Lymphadenopathy- none Head- atraumatic            Eyes- Gross vision intact, PERRLA, conjunctivae and secretions clear            Ears- Hearing, canals-normal            Nose- Clear, no-Septal dev, mucus, polyps, erosion, perforation             Throat- Mallampati II-III , mucosa clear , drainage- none, tonsils- atrophic Neck- flexible , trachea midline, no stridor , thyroid nl, carotid no bruit Chest - symmetrical excursion , unlabored           Heart/CV- RRR , no murmur , no gallop  , no rub, nl s1 s2                           - JVD- none , edema- none, stasis changes- none, varices- none           Lung- clear to P&A, wheeze- none, cough- none , dullness-none, rub- none, 93% RA           Chest wall-  Abd- Br/ Gen/ Rectal- Not done,  not indicated Extrem- cyanosis- none, clubbing, none, atrophy- none, strength- nl Neuro- grossly intact to observation

## 2014-03-17 NOTE — Assessment & Plan Note (Signed)
Good control with flurazepam. Psychophysiologic insomnia component now much improved. Plan- refill flurazepam- use as needed, skip when able. Good sleep hygiene

## 2014-03-17 NOTE — Progress Notes (Signed)
Quick Note:  Spoke with pt, he is aware of results. Nothing further needed. ______ 

## 2014-03-17 NOTE — Patient Instructions (Signed)
Order- CXR dx dyspnea  Order- Office spirometry   Dx dyspnea  Script refilling Flurazepam to send to [facility]

## 2014-03-17 NOTE — Assessment & Plan Note (Signed)
Former smoker. Normal spirometry is reassuring. Sat is 93% today. No suspicion of heart disease or PE Plan CXR

## 2014-05-11 ENCOUNTER — Telehealth: Payer: Self-pay | Admitting: Internal Medicine

## 2014-05-11 MED ORDER — FLURAZEPAM HCL 30 MG PO CAPS: ORAL_CAPSULE | ORAL | Status: DC

## 2014-05-11 NOTE — Telephone Encounter (Signed)
Pt returned call.  Spoke with patient who reports that he did take his rx from [facility] [place] but they are telling him that he will need a new rx and he is out of his medication.  Advised pt will call the pharmacy and let him know what I find out.  [facility]onna.  She reports they have nothing current on file for pt.  Was able to give Lupita LeashDonna verbal authorization for refill on the FLURAZEPAM 30mg  1po qhs prn #90 with 3 refills as last done by CDY.  Med list updated.  Called spoke with patient and made him aware.  Nothing further needed at this time; will sign off.

## 2014-05-11 NOTE — Telephone Encounter (Signed)
On 03/17/14 pt was giving RX for flurazepam #90 x 3 refills 1 po qhs PRN  Called pt and LMTCB x1

## 2014-12-08 ENCOUNTER — Telehealth: Payer: Self-pay | Admitting: Internal Medicine

## 2014-12-08 MED ORDER — FLURAZEPAM HCL 30 MG PO CAPS: ORAL_CAPSULE | ORAL | Status: DC

## 2014-12-08 NOTE — Telephone Encounter (Signed)
Pt has called back & he is completely out of these meds. Ph # [phone]

## 2014-12-08 NOTE — Telephone Encounter (Signed)
Rx has been called in per CY. Pt is aware. Nothing further was needed. 

## 2014-12-08 NOTE — Telephone Encounter (Signed)
Per CY---  Ok to refill

## 2014-12-08 NOTE — Telephone Encounter (Signed)
Last OV 03/17/14 Pending OV 03/18/15 Last refill on Flurazepam was on 05/11/14 with 3 additional refills #90  CY - please advise on refill. Thanks.

## 2015-03-18 ENCOUNTER — Ambulatory Visit: Payer: 59 | Admitting: Internal Medicine

## 2015-06-09 ENCOUNTER — Other Ambulatory Visit: Payer: Self-pay | Admitting: Internal Medicine

## 2015-06-10 ENCOUNTER — Telehealth: Payer: Self-pay | Admitting: Internal Medicine

## 2015-06-10 NOTE — Telephone Encounter (Signed)
Lmtcb.

## 2015-06-10 NOTE — Telephone Encounter (Signed)
Last seen 03/2014 No pending appts CY please advise if ok to refill. thanks

## 2015-06-10 NOTE — Telephone Encounter (Signed)
Ok to refill 

## 2015-06-11 MED ORDER — FLURAZEPAM HCL 30 MG PO CAPS: ORAL_CAPSULE | ORAL | Status: DC

## 2015-06-11 NOTE — Telephone Encounter (Signed)
Patient returned call, can be reached at [phone].

## 2015-06-11 NOTE — Telephone Encounter (Signed)
Per refill request 06/09/15: Waymon Budge, MD at 06/10/2015 12:06 PM     Status: Signed       Expand All Collapse All   Ok to refill            Marcellus Scott, CMA at 06/10/2015 11:59 AM     Status: Signed       Expand All Collapse All   Last seen 03/2014 No pending appts CY please advise if ok to refill. thanks      ---   [facility]  Called pt and is aware. Nothing further needed

## 2015-07-21 IMAGING — CR DG CHEST 2V
2 series · 2 of 2 positions shown · non-contrast
Comparison: Report from study 05/25/2011

CLINICAL DATA: Shortness of breath.

EXAM:
CHEST  2 VIEW

[view not recorded (1 of 2)]
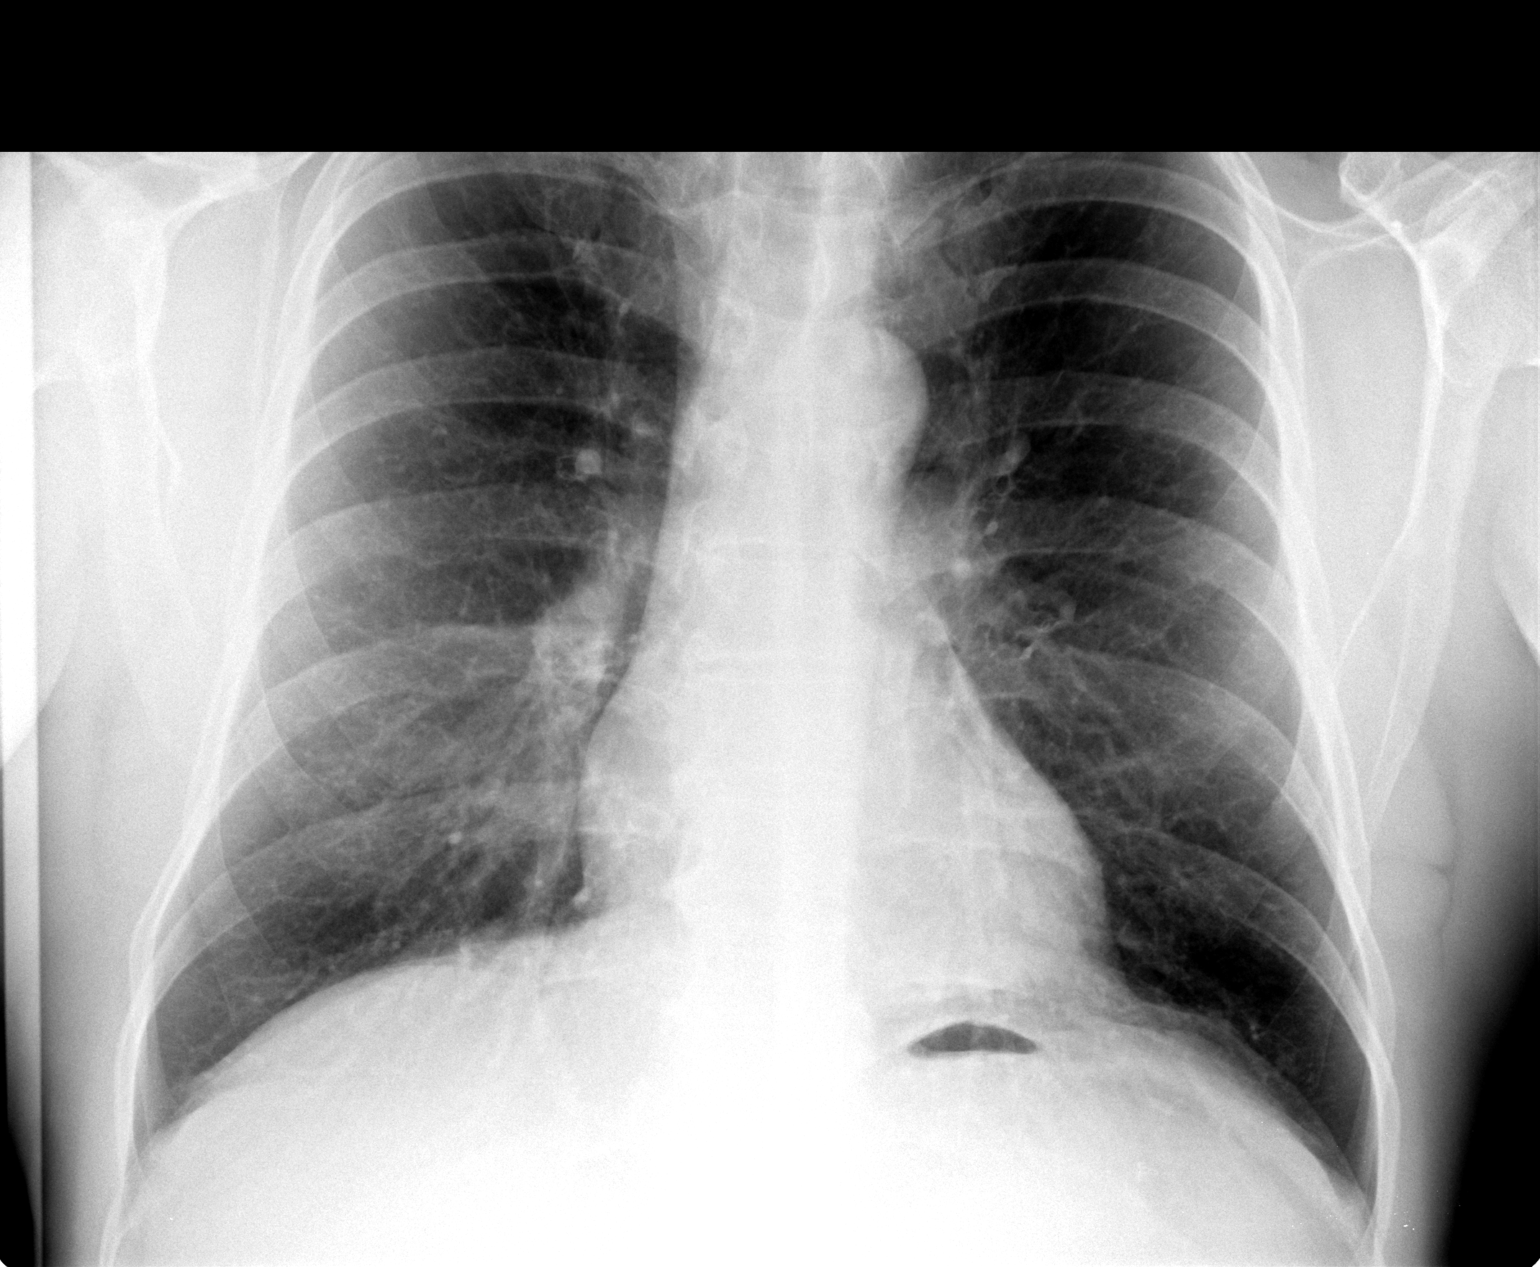

[view not recorded (2 of 2)]
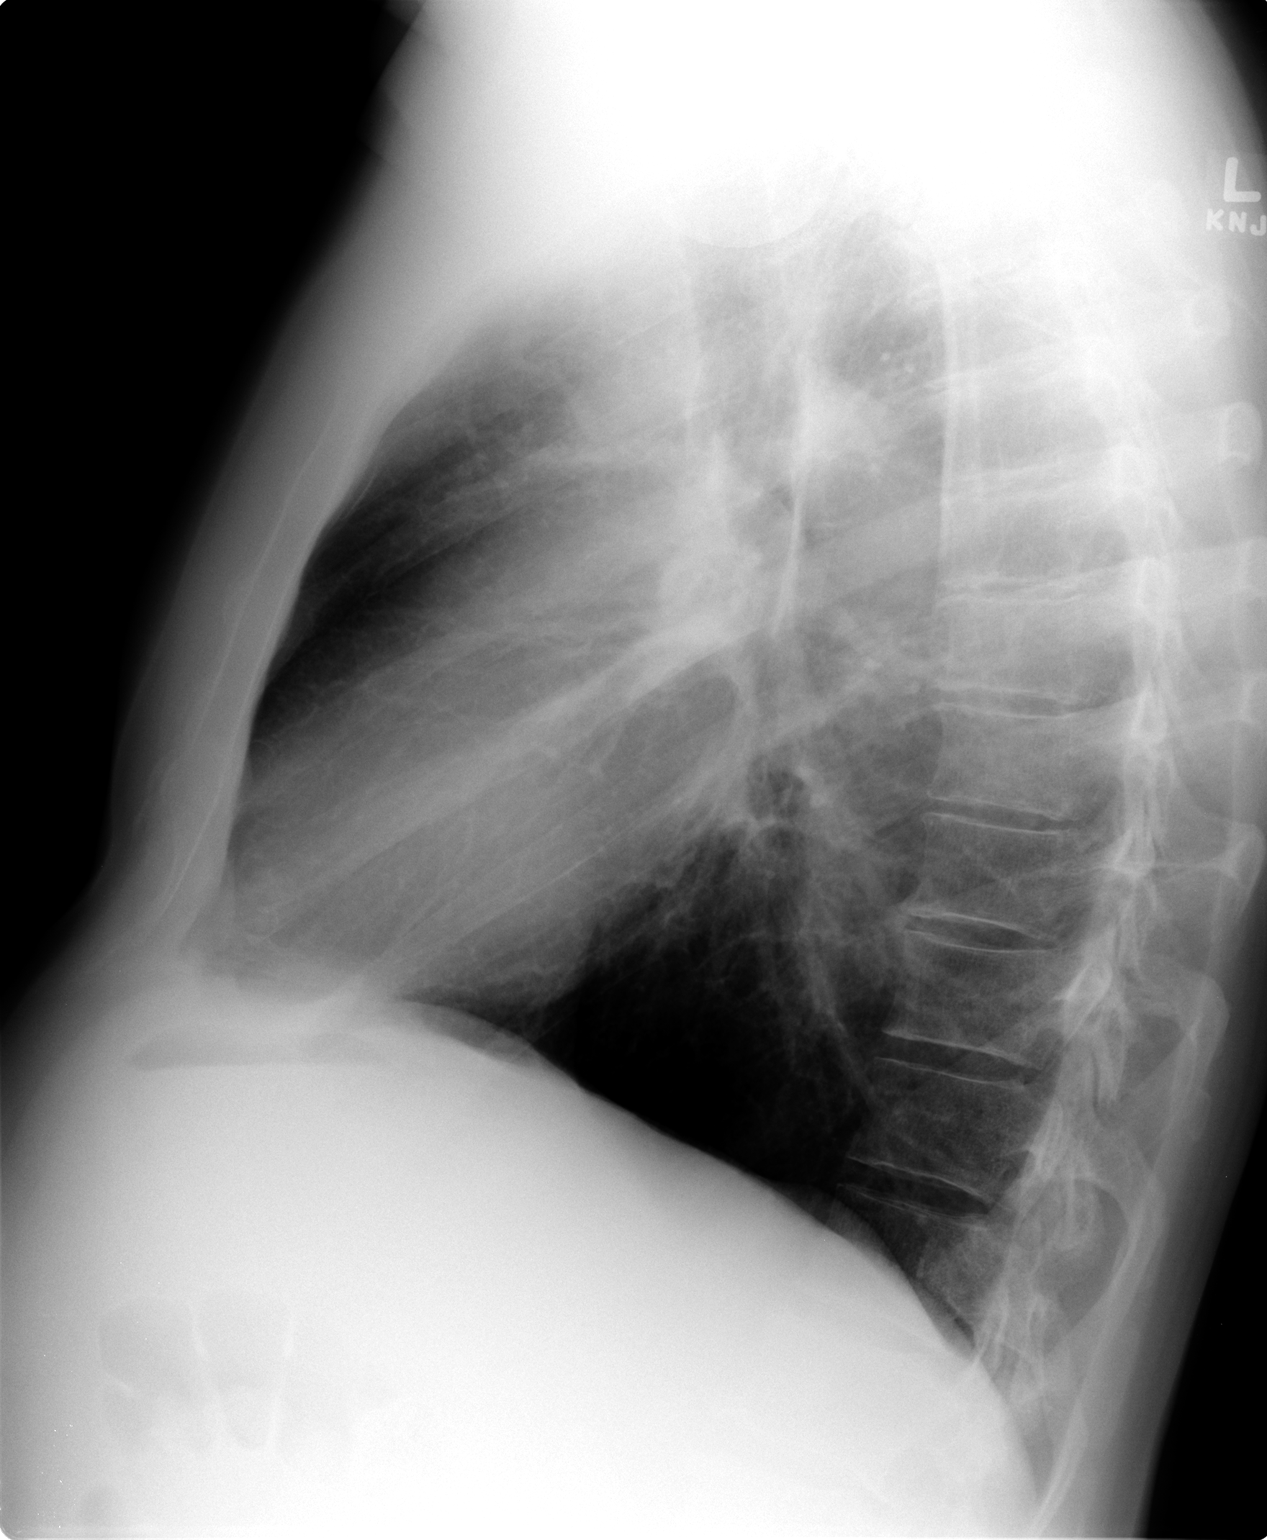

[2 of 2 positions shown; findings below may reference images not displayed]

FINDINGS: The heart size and mediastinal contours are within normal limits.
Both lungs are clear. The visualized skeletal structures are
unremarkable.
IMPRESSION: No active cardiopulmonary disease.

## 2015-12-10 ENCOUNTER — Other Ambulatory Visit: Payer: Self-pay | Admitting: Internal Medicine

## 2015-12-10 MED ORDER — FLURAZEPAM HCL 30 MG PO CAPS: ORAL_CAPSULE | ORAL | Status: AC

## 2015-12-10 NOTE — Telephone Encounter (Signed)
Per CY-okay to refill with 2 additional refills. Rx called to pharmacy. Nothing more needed at this time.

## 2016-09-18 ENCOUNTER — Encounter: Payer: Self-pay | Admitting: Internal Medicine

## 2016-09-18 ENCOUNTER — Telehealth: Payer: Self-pay | Admitting: Internal Medicine

## 2016-09-18 ENCOUNTER — Other Ambulatory Visit: Payer: Self-pay | Admitting: Internal Medicine

## 2016-09-18 MED ORDER — AZELASTINE-FLUTICASONE 137-50 MCG/ACT NA SUSP: 2.0000 | Freq: Every day | NASAL | 11 refills | Status: AC

## 2016-09-18 NOTE — Telephone Encounter (Signed)
Patient returning phone call 

## 2016-09-18 NOTE — Telephone Encounter (Signed)
CY  Please advise-  Disregard previous message about pt. Requesting a refill of dymista. Pt. states he is requesting a refill of his flurazepam.

## 2016-09-18 NOTE — Telephone Encounter (Signed)
Atc pt X3, line rang to fast busy signal. Pt has not been since 03/2014 and will need to be seen before any refills can be dispensed.   Wcb.

## 2016-09-18 NOTE — Telephone Encounter (Signed)
Pt returning call for the name of [facility] [place] for flurazepam 30 mg pt is down to last pill will be out tomorrow he can be reached @ [phone] for text or you can call [phone].Caren GriffinsStanley A Dalton

## 2016-09-18 NOTE — Telephone Encounter (Signed)
Ok to refill x 1 year 

## 2016-09-18 NOTE — Telephone Encounter (Signed)
lmtcb for pt.  

## 2016-09-18 NOTE — Telephone Encounter (Signed)
Ok to refill x 12 months 

## 2016-09-18 NOTE — Telephone Encounter (Signed)
Called and spoke to pt. Pt is requesting a refill of Dymista. Pt last seen in 2015 and advised to f/u in 1 year. Pt states he does not have insurance is unable to afford an OV. Pt is requesting we fill the Dymista and he can f/u with CY in 6 months.   Dr. Maple HudsonYoung please advise if ok to fill Dymista. Thanks.   No Known Allergies  Current Outpatient Prescriptions on File Prior to Visit  Medication Sig Dispense Refill  . Azelastine-Fluticasone (DYMISTA) 137-50 MCG/ACT SUSP Place 2 sprays into both nostrils at bedtime. 1 Bottle 0  . flurazepam (DALMANE) 30 MG capsule Take 1 tab for sleep as needed. 90 capsule 2  . gemfibrozil (LOPID) 600 MG tablet Take 1 tablet by mouth 2 (two) times daily.     No current facility-administered medications on file prior to visit.

## 2016-09-19 NOTE — Telephone Encounter (Signed)
LMTC x 1  

## 2016-09-19 NOTE — Telephone Encounter (Signed)
We could see him as a level 2 follow-up visit, but do think he would do better to maintain contact with a primary care doctor who could cover for all his needs. Up to him.

## 2016-09-19 NOTE — Telephone Encounter (Signed)
We cannot fill this any longer. He can take an otc sleep med, or see his primary care physician.

## 2016-09-19 NOTE — Telephone Encounter (Signed)
Called and spoke to pt. Pt is requesting refill Flurazepam 30mg  (1 tab qhs prn) Last filled on 3.10.17 for #90 with 2 refills.  Pt last seen on 03/17/2014 by CY, pt states he is unable to come in for an OV d/t not having insurance and not being able to afford it. Pt would like to be called back at [phone] or [phone].  Dr. Maple HudsonYoung please advise. Thanks.   No Known Allergies  Current Outpatient Prescriptions on File Prior to Visit  Medication Sig Dispense Refill  . Azelastine-Fluticasone (DYMISTA) 137-50 MCG/ACT SUSP Place 2 sprays into both nostrils at bedtime. 1 Bottle 11  . flurazepam (DALMANE) 30 MG capsule Take 1 tab for sleep as needed. 90 capsule 2  . gemfibrozil (LOPID) 600 MG tablet Take 1 tablet by mouth 2 (two) times daily.     No current facility-administered medications on file prior to visit.

## 2016-09-19 NOTE — Telephone Encounter (Signed)
Pt wanting to know if he makes an appt if Dr Maple HudsonYoung would refill this medication.  Pt states that he does not have insurance so he is aware that he will have to pay out of pocket for the OV if he were to come in.  Please advise Dr Maple HudsonYoung and also advise if able to see and refill this what level you would charge him so that we can give an estimate cost.  Thanks.

## 2016-09-19 NOTE — Telephone Encounter (Signed)
Patient returned phone call.Connor Guerrero ° °

## 2016-09-19 NOTE — Telephone Encounter (Signed)
Left detailed message to return call Level-2 follow-up visit will cost $105 with 65% discount --- $45 round about for the office visit out of pocket without insurance.

## 2016-09-20 NOTE — Telephone Encounter (Signed)
lmtcb x2 for pt. 

## 2016-09-21 ENCOUNTER — Encounter: Payer: Self-pay | Admitting: Internal Medicine

## 2016-09-21 NOTE — Telephone Encounter (Signed)
LMTCB x 3 Will try one more time and if no answer this message will be closed per triage protocol

## 2016-09-22 NOTE — Telephone Encounter (Signed)
Called and spoke with pt and he stated that he went over to [facility] and they did his rx for him.  He was very appreciative of all of the help that we offered for the cost of the OV.

## 2016-11-30 DEATH — deceased
